# Patient Record
Sex: Female | Born: 2000 | Race: Black or African American | Hispanic: No | Marital: Single
Health system: Southern US, Community
[De-identification: ages and names within clinical notes are randomized; demographics above are authoritative.]

---
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
## 2021-07-29 IMAGING — DX DG CHEST 1V PORT
1 series · 1 of 1 positions shown · non-contrast
Comparison: February 20, 2011

CLINICAL DATA: Cough fever shortness of breath

EXAM:
PORTABLE CHEST 1 VIEW

[chest ap]
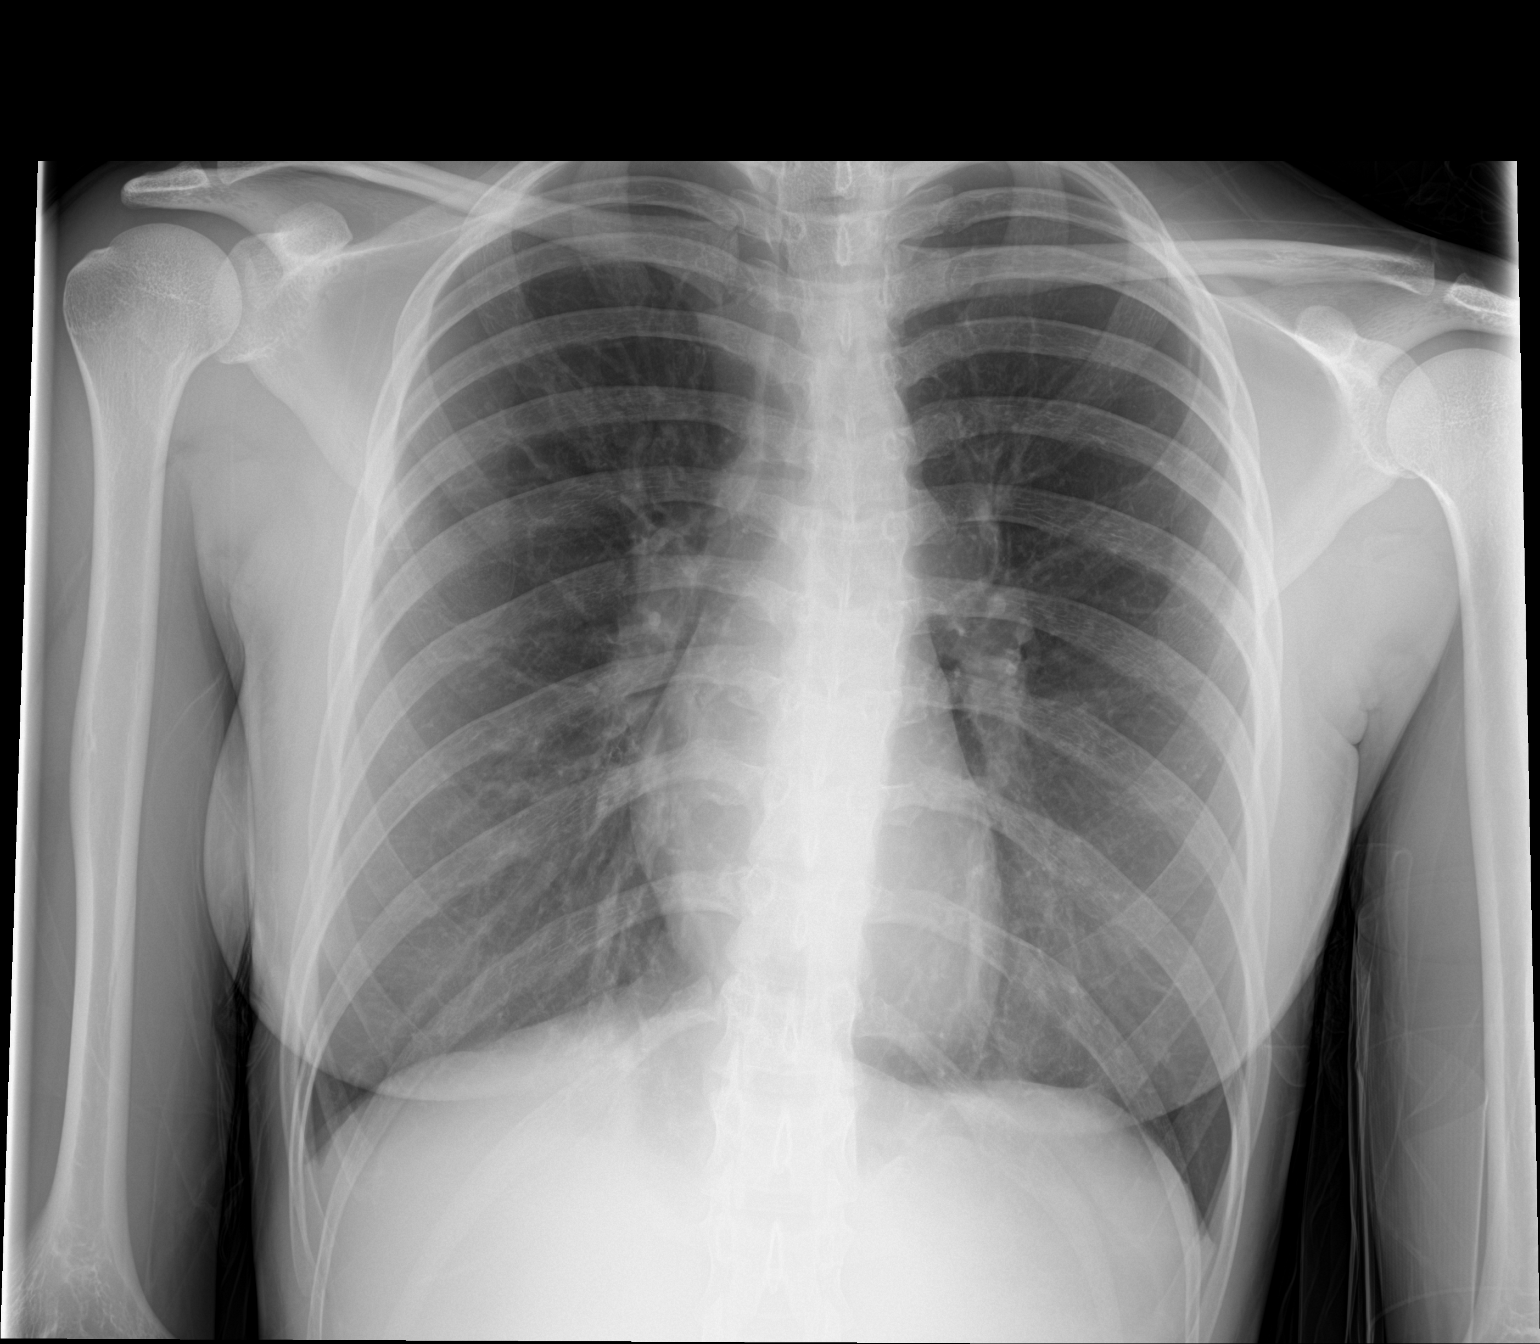

[1 of 1 positions shown; findings below may reference images not displayed]

FINDINGS: The heart size and mediastinal contours are within normal limits.
Both lungs are clear. The visualized skeletal structures are
unremarkable.
IMPRESSION: No active disease.
